# Patient Record
Sex: Male | Born: 1995 | Race: White | Hispanic: No | Marital: Single | Smoking: Never smoker
Health system: Southern US, Community
[De-identification: ages and names within clinical notes are randomized; demographics above are authoritative.]

## PROBLEM LIST (undated history)

## (undated) DIAGNOSIS — F419 Anxiety disorder, unspecified: Secondary | ICD-10-CM

## (undated) DIAGNOSIS — F32A Depression, unspecified: Secondary | ICD-10-CM

## (undated) DIAGNOSIS — F329 Major depressive disorder, single episode, unspecified: Secondary | ICD-10-CM

## (undated) HISTORY — PX: EYE SURGERY: SHX253

---

## 2017-08-01 ENCOUNTER — Emergency Department
Admission: EM | Admit: 2017-08-01 | Discharge: 2017-08-01 | Disposition: A | Discharge status: Home / Self Care | Payer: BLUE CROSS/BLUE SHIELD | Attending: Emergency Medicine | Admitting: Emergency Medicine

## 2017-08-01 DIAGNOSIS — E860 Dehydration: Secondary | ICD-10-CM | POA: Diagnosis not present

## 2017-08-01 DIAGNOSIS — F10920 Alcohol use, unspecified with intoxication, uncomplicated: Secondary | ICD-10-CM | POA: Insufficient documentation

## 2017-08-01 DIAGNOSIS — F10929 Alcohol use, unspecified with intoxication, unspecified: Secondary | ICD-10-CM | POA: Diagnosis present

## 2017-08-01 LAB — BASIC METABOLIC PANEL
ANION GAP: 11 (ref 5–15)
BUN: 16 mg/dL (ref 6–20)
CALCIUM: 8.9 mg/dL (ref 8.9–10.3)
CO2: 25 mmol/L (ref 22–32)
Chloride: 104 mmol/L (ref 101–111)
Creatinine, Ser: 0.78 mg/dL (ref 0.61–1.24)
GFR calc Af Amer: >60 mL/min (ref >60)
GFR calc non Af Amer: >60 mL/min (ref >60)
GLUCOSE: 130 mg/dL — AB (ref 65–99)
POTASSIUM: 3 mmol/L — AB (ref 3.5–5.1)
Sodium: 140 mmol/L (ref 135–145)

## 2017-08-01 LAB — CBC
HEMATOCRIT: 46.2 % (ref 40.0–52.0)
Hemoglobin: 15.7 g/dL (ref 13.0–18.0)
MCH: 28.3 pg (ref 26.0–34.0)
MCHC: 33.9 g/dL (ref 32.0–36.0)
MCV: 83.5 fL (ref 80.0–100.0)
Platelets: 460 10*3/uL — ABNORMAL HIGH (ref 150–440)
RBC: 5.53 MIL/uL (ref 4.40–5.90)
RDW: 13.3 % (ref 11.5–14.5)
WBC: 17.4 10*3/uL — AB (ref 3.8–10.6)

## 2017-08-01 LAB — ETHANOL: Alcohol, Ethyl (B): 263 mg/dL — ABNORMAL HIGH (ref <10)

## 2017-08-01 LAB — GLUCOSE, CAPILLARY: Glucose-Capillary: 112 mg/dL — ABNORMAL HIGH (ref 65–99)

## 2017-08-01 MED ORDER — POTASSIUM CHLORIDE 20 MEQ PO PACK
40.0000 meq | PACK | Freq: Once | ORAL | Status: AC
  Administered 2017-08-01: 40 meq via ORAL
  Filled 2017-08-01: qty 2

## 2017-08-01 MED ORDER — SODIUM CHLORIDE 0.9 % IV BOLUS
1000.0000 mL | Freq: Once | INTRAVENOUS | Status: AC
  Administered 2017-08-01: 1000 mL via INTRAVENOUS

## 2017-08-01 MED ORDER — DEXTROSE-NACL 5-0.45 % IV SOLN
Freq: Once | INTRAVENOUS | Status: AC
  Administered 2017-08-01: 02:00:00 via INTRAVENOUS

## 2017-08-01 MED ORDER — ONDANSETRON HCL 4 MG/2ML IJ SOLN
INTRAMUSCULAR | Status: AC
  Administered 2017-08-01: 4 mg via INTRAVENOUS
  Filled 2017-08-01: qty 2

## 2017-08-01 MED ORDER — ONDANSETRON HCL 4 MG/2ML IJ SOLN
4.0000 mg | Freq: Once | INTRAMUSCULAR | Status: AC
Start: 2017-08-01 — End: 2017-08-01
  Administered 2017-08-01: 4 mg via INTRAVENOUS

## 2017-08-01 NOTE — ED Notes (Signed)
Patient given sandwich tray and water at this time.  

## 2017-08-01 NOTE — ED Notes (Signed)
Patient resting quietly with eyes closed

## 2017-08-01 NOTE — ED Provider Notes (Signed)
[facility] Emergency Department Provider Note  ____________________________________________  Time seen: Approximately 2:14 AM  I have reviewed the triage vital signs and the nursing notes.   HISTORY  Chief Complaint Alcohol Intoxication  Level 5 Caveat: Portions of the History and Physical were unable to be obtained due to altered mental status.   HPI Julian Hudson is a 22 y.o. male brought to the ED after a large amount of alcohol intake tonight. Patient was found unconscious by trash cans. He is arousable with painful stimulus but goes back to sleep quickly. No vomiting.      past medical history No known medical problems   There are no active problems to display for this patient.    past surgical history Unable to obtain   Prior to Admission medications   Not on File  none   Allergies No known allergies   No family history on file.  Social History Social History   Tobacco Use  . Smoking status: Not on file  Substance Use Topics  . Alcohol use: Not on file  . Drug use: Not on file  positive for heavy alcohol intake tonight  Review of Systems Level 5 Caveat: Portions of the History and Physical were unable to be obtained due to altered mental status.  ____________________________________________   PHYSICAL EXAM:  VITAL SIGNS: ED Triage Vitals  Enc Vitals Group     BP 08/01/17 0105 119/72     Pulse Rate 08/01/17 0105 94     Resp 08/01/17 0105 16     Temp --      Temp src --      SpO2 08/01/17 0105 97 %     Weight 08/01/17 0110 160 lb (72.6 kg)     Height 08/01/17 0110  (1.778 m)     Head Circumference --      Peak Flow --      Pain Score --      Pain Loc --      Pain Edu? --      Excl. in GC? --     Vital signs reviewed, nursing assessments reviewed.   Constitutional:   stuporous, arousable. Not in distress Eyes:   Conjunctivae are normal. PERRL. ENT      Head:   Normocephalic and atraumatic.  Nose:   No congestion/rhinnorhea. no epistaxis      Mouth/Throat:   MMM      Neck:   No meningismus. Full ROM. Hematological/Lymphatic/Immunilogical:   No cervical lymphadenopathy. Cardiovascular:   RRR. Symmetric bilateral radial and DP pulses.  No murmurs.  Respiratory:   Normal respiratory effort without tachypnea/retractions. Breath sounds are clear and equal bilaterally. No wheezes/rales/rhonchi. Gastrointestinal:   Soft and nontender. Non distended. There is no CVA tenderness.  No rebound, rigidity, or guarding.  Musculoskeletal:   Normal range of motion in all extremities. No joint effusions.  No lower extremity tenderness.  No edema. Neurologic:   Stuporous moves all extremities when aroused  Skin:    Skin is warm, dry and intact. No rash noted.  No petechiae, purpura, or bullae.  ____________________________________________    LABS (pertinent positives/negatives) (all labs ordered are listed, but only abnormal results are displayed) Labs Reviewed  GLUCOSE, CAPILLARY - Abnormal; Notable for the following components:      Result Value   Glucose-Capillary 112 (*)    All other components within normal limits  CBC - Abnormal; Notable for the following components:   WBC 17.4 (*)  Platelets 460 (*)    All other components within normal limits  BASIC METABOLIC PANEL - Abnormal; Notable for the following components:   Potassium 3.0 (*)    Glucose, Bld 130 (*)    All other components within normal limits  ETHANOL - Abnormal; Notable for the following components:   Alcohol, Ethyl (B) 263 (*)    All other components within normal limits   ____________________________________________   EKG    ____________________________________________    RADIOLOGY  No results found.  ____________________________________________   PROCEDURES Procedures  ____________________________________________    CLINICAL IMPRESSION / ASSESSMENT AND PLAN / ED COURSE  Pertinent labs &  imaging results that were available during my care of the patient were reviewed by me and considered in my medical decision making (see chart for details).      Clinical Course as of Aug 01 704  Wynelle Link Aug 01, 2017  0106 P/w alcohol intoxication. Pt stuporous. Maintaining airway. Nl VS. Will hydrate, observe. No evidence of trauma or coingestants.    [PS]  0348 Pt now awake and alert. Reassessed. Tachycardic. Well appearing, calm, comfortable, cooperative. Exam benign. No abd tenderness. +UOP. Continue hydration. suspect significant diuresis effect from alcohol intake.    [PS]    Clinical Course User Index [PS] Sharman Cheek, MD     ----------------------------------------- 7:06 AM on 08/01/2017 -----------------------------------------  Vitals remain stable, tachycardia improved with IV hydration. Patient is awake alert oriented, tolerating oral intake, clinically sober and suitable for discharge home.  ____________________________________________   FINAL CLINICAL IMPRESSION(S) / ED DIAGNOSES    Final diagnoses:  Acute alcoholic intoxication without complication (HCC)  Dehydration     ED Discharge Orders    None      Portions of this note were generated with dragon dictation software. Dictation errors may occur despite best attempts at proofreading.    Sharman Cheek, MD 08/01/17 [phone]

## 2017-08-01 NOTE — ED Triage Notes (Signed)
EMS report pt was found unconscious out by trash cans.  Pt is diaphoretic and arousable to painful stimuli.

## 2017-08-01 NOTE — ED Notes (Signed)
Pt awakens easily to voice.  Now maintains eye contact.  Does not remember what happened.  Reoriented to place.  Pt thought he was at [facility].  Once reoriented, pt maintained cognition.  Resting quietly.  Cooperative with staff and plan of care.  Denies any needs at this time.

## 2018-01-05 ENCOUNTER — Emergency Department: Payer: BLUE CROSS/BLUE SHIELD

## 2018-01-05 ENCOUNTER — Emergency Department
Admission: EM | Admit: 2018-01-05 | Discharge: 2018-01-05 | Disposition: A | Discharge status: Home / Self Care | Payer: BLUE CROSS/BLUE SHIELD | Attending: Emergency Medicine | Admitting: Emergency Medicine

## 2018-01-05 ENCOUNTER — Other Ambulatory Visit: Payer: Self-pay

## 2018-01-05 ENCOUNTER — Encounter: Payer: Self-pay | Admitting: Emergency Medicine

## 2018-01-05 DIAGNOSIS — R51 Headache: Secondary | ICD-10-CM | POA: Diagnosis present

## 2018-01-05 DIAGNOSIS — G43901 Migraine, unspecified, not intractable, with status migrainosus: Secondary | ICD-10-CM | POA: Diagnosis not present

## 2018-01-05 LAB — BASIC METABOLIC PANEL
Anion gap: 12 (ref 5–15)
BUN: 14 mg/dL (ref 6–20)
CO2: 24 mmol/L (ref 22–32)
CREATININE: 0.69 mg/dL (ref 0.61–1.24)
Calcium: 9.3 mg/dL (ref 8.9–10.3)
Chloride: 102 mmol/L (ref 98–111)
GFR calc Af Amer: >60 mL/min (ref >60)
GFR calc non Af Amer: >60 mL/min (ref >60)
GLUCOSE: 98 mg/dL (ref 70–99)
POTASSIUM: 4 mmol/L (ref 3.5–5.1)
SODIUM: 138 mmol/L (ref 135–145)

## 2018-01-05 LAB — CBC
HCT: 44 % (ref 39.0–52.0)
HEMOGLOBIN: 15.3 g/dL (ref 13.0–17.0)
MCH: 28.3 pg (ref 26.0–34.0)
MCHC: 34.8 g/dL (ref 30.0–36.0)
MCV: 81.5 fL (ref 80.0–100.0)
Platelets: 164 10*3/uL (ref 150–400)
RBC: 5.4 MIL/uL (ref 4.22–5.81)
RDW: 11.8 % (ref 11.5–15.5)
WBC: 8 10*3/uL (ref 4.0–10.5)
nRBC: 0 % (ref 0.0–0.2)

## 2018-01-05 LAB — INFLUENZA PANEL BY PCR (TYPE A & B)
INFLAPCR: NEGATIVE
INFLBPCR: NEGATIVE

## 2018-01-05 LAB — GROUP A STREP BY PCR: Group A Strep by PCR: NOT DETECTED

## 2018-01-05 MED ORDER — KETOROLAC TROMETHAMINE 30 MG/ML IJ SOLN
15.0000 mg | Freq: Once | INTRAMUSCULAR | Status: AC
  Administered 2018-01-05: 15 mg via INTRAVENOUS
  Filled 2018-01-05: qty 1

## 2018-01-05 MED ORDER — DIPHENHYDRAMINE HCL 50 MG/ML IJ SOLN
12.5000 mg | Freq: Once | INTRAMUSCULAR | Status: AC
  Administered 2018-01-05: 12.5 mg via INTRAVENOUS
  Filled 2018-01-05: qty 1

## 2018-01-05 MED ORDER — METOCLOPRAMIDE HCL 5 MG/ML IJ SOLN
10.0000 mg | Freq: Once | INTRAMUSCULAR | Status: AC
  Administered 2018-01-05: 10 mg via INTRAVENOUS
  Filled 2018-01-05: qty 2

## 2018-01-05 MED ORDER — BUTALBITAL-APAP-CAFFEINE 50-325-40 MG PO TABS: 1.0000 | ORAL_TABLET | Freq: Four times a day (QID) | ORAL | 0 refills | Status: AC | PRN

## 2018-01-05 MED ORDER — SODIUM CHLORIDE 0.9 % IV BOLUS
1000.0000 mL | Freq: Once | INTRAVENOUS | Status: AC
  Administered 2018-01-05: 1000 mL via INTRAVENOUS

## 2018-01-05 NOTE — ED Triage Notes (Signed)
Pt states he was hospitalized in [place] last Friday for headache and swollen throat. Mono/strep neg at that time. Pt reports swelling has decreased but he still has headache, neck and back stiffness. Also reports loss of appetite and feeling disoriented. Pt states he was told by hospital in [place] that if symptoms persisted he should return to an ED. Hx migraines but states this feels different.

## 2018-01-05 NOTE — ED Notes (Signed)
Pt is wearing mask in room.

## 2018-01-05 NOTE — Discharge Instructions (Addendum)

## 2018-01-05 NOTE — ED Provider Notes (Signed)
[facility] Emergency Department Provider Note  ____________________________________________  Time seen: Approximately 2:26 PM  I have reviewed the triage vital signs and the nursing notes.   HISTORY  Chief Complaint Headache and Torticollis   HPI Julian Hudson is a 22 y.o. male with a history of anxiety migraine headaches who presents for evaluation of headache and neck pain.  Patient reports a 4 days ago he was driving to [facility] when he started feeling unwell. He reports that he felt weak and had some swelling of his jaw which made him concerned for mumps. He also developed a constant HA since then that he describes as frontal and occipital, sharp, constant, moderate in intensity, and associated with photophobia.  He went to a local ER in [place] and was tested negative for Mumps, Strep, and Mono. He felt better after living the Hospital.  He reports that the headache has been persistent.  He has tried Excedrin Migraine at home with no relief.  He reports that he feels the headache going down his neck.  He reports that the photophobia has gotten worse.  He denies thunderclap headache or severe headache, no family history of AVM or aneurysms or brain tumors.  No unilateral weakness or numbness, vertigo, changes in vision, facial droop, slurred speech, or gait instability.  He denies having any fevers.  Over the last 24 hours he has developed a mild sore throat.  He denies tick bites, rashes, chest pain, shortness of breath, URI symptoms, vomiting, diarrhea, abdominal pain.  He does endorse mild nausea.  He reports that he feels slightly disoriented at times and since the symptoms started 4 days ago.  No known sick contact exposures.  Patient is a Consulting civil engineer at [employer].  Denies any trauma.  Vaccines are up-to-date.  History reviewed. No pertinent past medical history.  There are no active problems to display for this patient.   Past Surgical History:  Procedure Laterality  Date  . EYE SURGERY      Prior to Admission medications   Medication Sig Start Date End Date Taking? Authorizing Provider  butalbital-acetaminophen-caffeine (FIORICET, ESGIC) 50-325-40 MG tablet Take 1-2 tablets by mouth every 6 (six) hours as needed for headache. 01/05/18 01/05/19  Nita Sickle, MD    Allergies Patient has no known allergies.  History reviewed. No pertinent family history.  Social History Social History   Tobacco Use  . Smoking status: Never Smoker  . Smokeless tobacco: Never Used  Substance Use Topics  . Alcohol use: Yes  . Drug use: Yes    Types: Marijuana    Review of Systems  Constitutional: Negative for fever. + disorientation Eyes: Negative for visual changes. ENT: + sore throat. Neck: No neck pain  Cardiovascular: Negative for chest pain. Respiratory: Negative for shortness of breath. Gastrointestinal: Negative for abdominal pain, vomiting or diarrhea. Genitourinary: Negative for dysuria. Musculoskeletal: Negative for back pain. Skin: Negative for rash. Neurological: Negative for weakness or numbness. + HA and photophobia Psych: No SI or HI  ____________________________________________   PHYSICAL EXAM:  VITAL SIGNS: ED Triage Vitals  Enc Vitals Group     BP 01/05/18 1250 138/80     Pulse Rate 01/05/18 1250 81     Resp 01/05/18 1250 18     Temp 01/05/18 1250 97.9 F (36.6 C)     Temp Source 01/05/18 1250 Oral     SpO2 01/05/18 1250 99 %     Weight 01/05/18 1250 155 lb (70.3 kg)  Height 01/05/18 1250 5\' 8"  (1.727 m)     Head Circumference --      Peak Flow --      Pain Score 01/05/18 1253 6     Pain Loc --      Pain Edu? --      Excl. in GC? --     Constitutional: Alert and oriented. Well appearing and in no apparent distress. HEENT:      Head: Normocephalic and atraumatic.         Eyes: Conjunctivae are normal. Sclera is non-icteric.       Mouth/Throat: Mucous membranes are moist.  Oropharynx is clear with no  swelling or exudate, no peritonsillar abscess, no parotid enlargement or tenderness, floor of the mouth is soft, no trismus, no stridor, handling his saliva with no difficulty, patent airway      Neck: Supple with no signs of meningismus. Paraspinal tenderness bilaterally, no cervical lymphadenopathy Cardiovascular: Regular rate and rhythm. No murmurs, gallops, or rubs. 2+ symmetrical distal pulses are present in all extremities. No JVD. Respiratory: Normal respiratory effort. Lungs are clear to auscultation bilaterally. No wheezes, crackles, or rhonchi.  Gastrointestinal: Soft, non tender, and non distended with positive bowel sounds. No rebound or guarding. Musculoskeletal: Nontender with normal range of motion in all extremities. No edema, cyanosis, or erythema of extremities. Neurologic: Normal speech and language. A & O x3, PERRL, EOMI, no nystagmus, CN II-XII intact, motor testing reveals good tone and bulk throughout. There is no evidence of pronator drift or dysmetria. Muscle strength is 5/5 throughout. Sensory examination is intact. Gait is normal. Skin: Skin is warm, dry and intact. No rash noted. Psychiatric: Mood and affect are normal. Speech and behavior are normal.  ____________________________________________   LABS (all labs ordered are listed, but only abnormal results are displayed)  Labs Reviewed  GROUP A STREP BY PCR  BASIC METABOLIC PANEL  CBC  INFLUENZA PANEL BY PCR (TYPE A & B)   ____________________________________________  EKG  none  ____________________________________________  RADIOLOGY  I have personally reviewed the images performed during this visit and I agree with the Radiologist's read.   Interpretation by Radiologist:  Ct Head Wo Contrast  Result Date: 01/05/2018 CLINICAL DATA:  Headache with neck stiffness. Intermittent disorientation EXAM: CT HEAD WITHOUT CONTRAST TECHNIQUE: Contiguous axial images were obtained from the base of the skull  through the vertex without intravenous contrast. COMPARISON:  None. FINDINGS: Brain: The ventricles are normal in size and configuration. There is mild invagination of CSF into the sella. There is no intracranial mass, hemorrhage, extra-axial fluid collection, or midline shift. Brain parenchyma appears normal. No acute infarct evident. Vascular: No hyperdense vessel.  No vascular calcification evident. Skull: Bony calvarium appears intact. Sinuses/Orbits: There is a subcentimeter osteoma in the left frontal sinus. Visualized paranasal sinuses are otherwise clear. Visualized orbits appear symmetric bilaterally. Other: Mastoid air cells are clear. 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 Ventricles normal in size and configuration. Brain parenchyma appears normal. No mass or hemorrhage. Subcentimeter benign osteoma left frontal region. Study otherwise unremarkable. Electronically Signed   By: Bretta Bang III M.D.   On: 01/05/2018 14:54      ____________________________________________   PROCEDURES  Procedure(s) performed: None Procedures Critical Care performed:  None ____________________________________________   INITIAL IMPRESSION / ASSESSMENT AND PLAN / ED COURSE   22 y.o. male with a history of anxiety migraine headaches who presents for evaluation of headache and neck pain  x 4 days, photophobia, and mild disorientation.  Patient has had no fever and symptoms are ongoing for 4 days therefore at this time no clinical signs or symptoms of bacterial meningitis with normal vitals, normal neuro exam, normal white count.  He does have paraspinal tenderness bilaterally but no true meningeal signs on exam.  Patient does have a history of migraine headaches and I believe his symptoms are most likely due to a viral etiology causing a exacerbation of his migraines.  Will treat with a migraine cocktail.  Due to ongoing symptoms for 4 days and  concerns for disorientation we will send patient for CT scan.  There is no thunderclap headache, no trauma, no neurological deficits making subarachnoid hemorrhage extremely less likely.  At this time do not believe patient warrants an LP.  We will recheck for strep, check a flu swab.  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 Discussed with Dr. Myer Haff who says this is an incidental finding and not the cause of patient's symptoms. Labs, Flu, and strep all negative. Patient's symptoms resolved after migraine cocktail. Will dc home, provide prescription for fioricet, close f/u with PCP.  Discussed return precautions for fever, thunderclap headache, neurological deficits.   [CV]    Clinical Course User Index [CV] Don Perking [place], MD     As part of my medical decision making, I reviewed the following data within the electronic MEDICAL RECORD NUMBER Nursing notes reviewed and incorporated, Labs reviewed , Radiograph reviewed , A consult was requested and obtained from this/these consultant(s) NSG, Notes from prior ED visits and [place] Controlled Substance Database    Pertinent labs & imaging results that were available during my care of the patient were reviewed by me and considered in my medical decision making (see chart for details).    ____________________________________________   FINAL CLINICAL IMPRESSION(S) / ED DIAGNOSES  Final diagnoses:  Migraine with status migrainosus, not intractable, unspecified migraine type      NEW MEDICATIONS STARTED DURING THIS VISIT:  ED Discharge Orders         Ordered    butalbital-acetaminophen-caffeine (FIORICET, ESGIC) 50-325-40 MG tablet  Every 6 hours PRN     01/05/18 1555           Note:  This document was prepared using Dragon voice recognition software and may include unintentional dictation errors.    Don Perking, [place], MD 01/05/18 1556

## 2018-01-05 NOTE — ED Notes (Addendum)
Pt states he has had a headache and stiff neck and photophobia x1week with worsening symptoms. Pt states he has had trouble focusing and putting thoughts together.

## 2018-01-05 NOTE — ED Triage Notes (Signed)
First Nurse Note:  Arrives with c/o headache, stiff neck, light sensitivity and disorientation x 5 days.  Denies fever.  States was recently in [place] was seen through the ED for same symptoms.  AAOx3.  Skin warm and dry. Ambulates with easy and steady gait.  NAD

## 2018-05-30 ENCOUNTER — Other Ambulatory Visit: Payer: Self-pay

## 2018-05-30 ENCOUNTER — Emergency Department
Admission: EM | Admit: 2018-05-30 | Discharge: 2018-05-31 | Disposition: A | Discharge status: Home / Self Care | Payer: BLUE CROSS/BLUE SHIELD | Attending: Emergency Medicine | Admitting: Emergency Medicine

## 2018-05-30 DIAGNOSIS — R42 Dizziness and giddiness: Secondary | ICD-10-CM | POA: Insufficient documentation

## 2018-05-30 HISTORY — DX: Major depressive disorder, single episode, unspecified: F32.9

## 2018-05-30 HISTORY — DX: Depression, unspecified: F32.A

## 2018-05-30 HISTORY — DX: Anxiety disorder, unspecified: F41.9

## 2018-05-30 LAB — URINALYSIS, COMPLETE (UACMP) WITH MICROSCOPIC
Bacteria, UA: NONE SEEN
Bilirubin Urine: NEGATIVE
Glucose, UA: NEGATIVE mg/dL
Hgb urine dipstick: NEGATIVE
Ketones, ur: NEGATIVE mg/dL
Leukocytes,Ua: NEGATIVE
NITRITE: NEGATIVE
Protein, ur: NEGATIVE mg/dL
SPECIFIC GRAVITY, URINE: 1.011 (ref 1.005–1.030)
WBC, UA: NONE SEEN WBC/hpf (ref 0–5)
pH: 7 (ref 5.0–8.0)

## 2018-05-30 LAB — BASIC METABOLIC PANEL
Anion gap: 8 (ref 5–15)
BUN: 18 mg/dL (ref 6–20)
CO2: 25 mmol/L (ref 22–32)
Calcium: 8.7 mg/dL — ABNORMAL LOW (ref 8.9–10.3)
Chloride: 107 mmol/L (ref 98–111)
Creatinine, Ser: 1.07 mg/dL (ref 0.61–1.24)
GFR calc Af Amer: >60 mL/min (ref >60)
GFR calc non Af Amer: >60 mL/min (ref >60)
Glucose, Bld: 128 mg/dL — ABNORMAL HIGH (ref 70–99)
Potassium: 3.4 mmol/L — ABNORMAL LOW (ref 3.5–5.1)
Sodium: 140 mmol/L (ref 135–145)

## 2018-05-30 LAB — CBC
HCT: 44.6 % (ref 39.0–52.0)
Hemoglobin: 15.1 g/dL (ref 13.0–17.0)
MCH: 28.2 pg (ref 26.0–34.0)
MCHC: 33.9 g/dL (ref 30.0–36.0)
MCV: 83.2 fL (ref 80.0–100.0)
Platelets: 279 10*3/uL (ref 150–400)
RBC: 5.36 MIL/uL (ref 4.22–5.81)
RDW: 12.5 % (ref 11.5–15.5)
WBC: 8.5 10*3/uL (ref 4.0–10.5)
nRBC: 0 % (ref 0.0–0.2)

## 2018-05-30 MED ORDER — SODIUM CHLORIDE 0.9% FLUSH: 3.0000 mL | Freq: Once | INTRAVENOUS | Status: DC

## 2018-05-30 MED ORDER — SODIUM CHLORIDE 0.9 % IV BOLUS
1000.0000 mL | Freq: Once | INTRAVENOUS | Status: AC
  Administered 2018-05-30: 1000 mL via INTRAVENOUS

## 2018-05-30 NOTE — ED Triage Notes (Signed)
Pt to the er because his [place] psychiatrist told him to come to rule out cardiac issues versus anxiety. Pt is currently taking meds for anxiety. Pt reports dizziness, nausea and  Not feeling well. Pt states he is taking guanfacine.

## 2018-05-30 NOTE — ED Provider Notes (Signed)
[facility] Emergency Department Provider Note  ____________________________________________   First MD Initiated Contact with Patient 05/30/18 2314     (approximate)  I have reviewed the triage vital signs and the nursing notes.   HISTORY  Chief Complaint Dizziness    HPI Julian Hudson is a 23 y.o. male who has a history of anxiety and depression and who is a sophomore at [employer].  He presents for evaluation of  some symptoms that his psychiatrist at home in [place] was concerned could represent a medical issue rather than psychiatric issue.  The symptoms he is been experiencing over the last few weeks include dizziness, nausea, general malaise, and some lightheadedness, particular when changing position.  He is active in school, plays soccer, and lifts weights every day.  He says that he takes Intuniv and that it has "messed with my appetite" and he feels like he may not be eating and drinking enough recently.  He also states that when he was at home in [place] he was evaluated by a cardiologist who said that his heart rate goes up much faster when he stands up compared to when he lies down, and when he goes home he is going to continue the cardiology evaluation.  He describes the symptoms as is persistent over the last couple weeks.  Nothing in particular makes it better or worse.  He is not having any symptoms right now except that he still sometimes feels lightheaded.  He denies chest pain, shortness of breath, vomiting, abdominal pain, fever/chills, neck pain or neck stiffness.  He describes the symptoms overall as severe but they wax and wane.  He denies drug use and denies any new medications.     Past Medical History:  Diagnosis Date  . Anxiety   . Depression     There are no active problems to display for this patient.   Past Surgical History:  Procedure Laterality Date  . EYE SURGERY      Prior to Admission medications   Medication Sig  Start Date End Date Taking? Authorizing Provider  butalbital-acetaminophen-caffeine (FIORICET, ESGIC) 50-325-40 MG tablet Take 1-2 tablets by mouth every 6 (six) hours as needed for headache. 01/05/18 01/05/19  Nita Sickle, MD    Allergies Patient has no known allergies.  No family history on file.  Social History Social History   Tobacco Use  . Smoking status: Never Smoker  . Smokeless tobacco: Never Used  Substance Use Topics  . Alcohol use: Yes  . Drug use: Not Currently    Review of Systems Constitutional: No fever/chills.  Generalized lightheadedness or dizziness. Eyes: No visual changes. ENT: No sore throat. Cardiovascular: Denies chest pain. Respiratory: Denies shortness of breath. Gastrointestinal: No abdominal pain.  Intermittent nausea, no vomiting.  No diarrhea.  No constipation. Genitourinary: Negative for dysuria. Musculoskeletal: Negative for neck pain.  Negative for back pain. Integumentary: Negative for rash. Neurological: Generalized lightheadedness or dizziness.  Negative for headaches, focal weakness or numbness.   ____________________________________________   PHYSICAL EXAM:  VITAL SIGNS: ED Triage Vitals  Enc Vitals Group     BP 05/30/18 2127 (!) 149/83     Pulse Rate 05/30/18 2127 82     Resp 05/30/18 2127 16     Temp 05/30/18 2127 98.4 F (36.9 C)     Temp Source 05/30/18 2127 Oral     SpO2 05/30/18 2127 98 %     Weight 05/30/18 2128 72.6 kg (160 lb)     Height  05/30/18 2128 1.702 m (5\' 7" )     Head Circumference --      Peak Flow --      Pain Score 05/30/18 2128 4     Pain Loc --      Pain Edu? --      Excl. in GC? --     Constitutional: Alert and oriented. Well appearing and in no acute distress. Eyes: Conjunctivae are normal. PERRL. EOMI. Head: Atraumatic. Nose: No congestion/rhinnorhea. Mouth/Throat: Mucous membranes are moist. Neck: No stridor.  No meningeal signs.   Cardiovascular: Normal rate, regular rhythm. Good  peripheral circulation. Grossly normal heart sounds. Respiratory: Normal respiratory effort.  No retractions. Lungs CTAB. Gastrointestinal: Soft and nontender. No distention.  Musculoskeletal: No lower extremity tenderness nor edema. No gross deformities of extremities. Neurologic:  Normal speech and language. No gross focal neurologic deficits are appreciated.  Skin:  Skin is warm, dry and intact. No rash noted. Psychiatric: Mood and affect are normal. Speech and behavior are normal.  ____________________________________________   LABS (all labs ordered are listed, but only abnormal results are displayed)  Labs Reviewed  BASIC METABOLIC PANEL - Abnormal; Notable for the following components:      Result Value   Potassium 3.4 (*)    Glucose, Bld 128 (*)    Calcium 8.7 (*)    All other components within normal limits  URINALYSIS, COMPLETE (UACMP) WITH MICROSCOPIC - Abnormal; Notable for the following components:   Color, Urine STRAW (*)    APPearance CLEAR (*)    All other components within normal limits  CBC  TROPONIN I  CBG MONITORING, ED   ____________________________________________  EKG  ED ECG REPORT I, Loleta Rose, the attending physician, personally viewed and interpreted this ECG.  Date: 05/30/2018 EKG Time: 21: 35 Rate: 82 Rhythm: normal sinus rhythm QRS Axis: normal Intervals: normal ST/T Wave abnormalities: Non-specific ST segment / T-wave changes, but no clear evidence of acute ischemia. Narrative Interpretation: no definitive evidence of acute ischemia; does not meet STEMI criteria.   ____________________________________________  RADIOLOGY   ED MD interpretation: No indication for imaging  Official radiology report(s): No results found.  ____________________________________________   PROCEDURES   Procedure(s) performed (including Critical Care):  Procedures   ____________________________________________   INITIAL IMPRESSION / MDM /  ASSESSMENT AND PLAN / ED COURSE  As part of my medical decision making, I reviewed the following data within the electronic MEDICAL RECORD NUMBER Nursing notes reviewed and incorporated, Labs reviewed , EKG interpreted  and Old chart reviewed         Differential diagnosis includes, but is not limited to, anxiety/panic attacks, medication side effect, POTS syndrome, volume depletion leading to orthostatic hypotension, electrolyte abnormality, much less likely ACS.  The patient is well-appearing and in no distress, texting on his phone and having an easy and appropriate and normal conversation with me.  Symptoms have been going on for a couple of weeks.  No indication of ischemia on EKG, normal vital signs.  The work-up he describes to me that he had in [place] suggest he may have POTS syndrome, but it sounds like it is still being evaluated.  He feels well enough that he is still able to play soccer and lift weights every day as well as going to school.  We had a discussion about orthostatics and I provided reassurance about his lab work which is all within normal limits and reassuring.  His potassium is very slightly decreased at 3.4 but this is likely  noncontributory.  We agreed to place an IV and give 1 L normal saline IV bolus and then he will be discharged for outpatient follow-up.  There is no indication for further evaluation at this time and he is going to continue working with his psychiatrist as well as his doctors at home.  No indication of emergent medical condition currently.  He understands and agrees with the plan.     ____________________________________________  FINAL CLINICAL IMPRESSION(S) / ED DIAGNOSES  Final diagnoses:  Lightheadedness  Dizziness     MEDICATIONS GIVEN DURING THIS VISIT:  Medications  sodium chloride flush (NS) 0.9 % injection 3 mL (has no administration in time range)  sodium chloride 0.9 % bolus 1,000 mL (1,000 mLs Intravenous New Bag/Given 05/30/18 2359)      ED Discharge Orders    None       Note:  This document was prepared using Dragon voice recognition software and may include unintentional dictation errors.   Loleta Rose, MD 05/31/18 [phone]

## 2018-05-30 NOTE — Discharge Instructions (Signed)
Your workup in the Emergency Department today was reassuring.  We did not find any specific abnormalities.  As we discussed, it is likely that your symptoms are multifactorial; you likely are not drinking enough fluid in the setting of all of your activities (classes, soccer, weightlifting, etc.), and your medications may also be causing or contributing to some of your symptoms.  We recommend you drink plenty of fluids, take your regular medications and/or any new ones prescribed today, and follow up with your psychiatrist and other doctors back home.  I also included some information about panic attacks that you may want to read because you might find that some of the symptoms make sense are applied to you.  Return to the Emergency Department if you develop new or worsening symptoms that concern you.

## 2018-05-31 LAB — TROPONIN I

## 2018-06-07 ENCOUNTER — Emergency Department: Payer: BLUE CROSS/BLUE SHIELD

## 2018-06-07 ENCOUNTER — Encounter: Payer: Self-pay | Admitting: Emergency Medicine

## 2018-06-07 ENCOUNTER — Emergency Department
Admission: EM | Admit: 2018-06-07 | Discharge: 2018-06-08 | Disposition: A | Discharge status: Home / Self Care | Payer: BLUE CROSS/BLUE SHIELD | Attending: Emergency Medicine | Admitting: Emergency Medicine

## 2018-06-07 ENCOUNTER — Other Ambulatory Visit: Payer: Self-pay

## 2018-06-07 DIAGNOSIS — R42 Dizziness and giddiness: Secondary | ICD-10-CM | POA: Diagnosis present

## 2018-06-07 DIAGNOSIS — R55 Syncope and collapse: Secondary | ICD-10-CM

## 2018-06-07 DIAGNOSIS — R002 Palpitations: Secondary | ICD-10-CM | POA: Diagnosis not present

## 2018-06-07 LAB — TROPONIN I: Troponin I: <0.03 ng/mL (ref <0.03)

## 2018-06-07 LAB — BASIC METABOLIC PANEL
Anion gap: 12 (ref 5–15)
BUN: 22 mg/dL — ABNORMAL HIGH (ref 6–20)
CHLORIDE: 106 mmol/L (ref 98–111)
CO2: 22 mmol/L (ref 22–32)
Calcium: 10.2 mg/dL (ref 8.9–10.3)
Creatinine, Ser: 0.87 mg/dL (ref 0.61–1.24)
GFR calc Af Amer: >60 mL/min (ref >60)
GFR calc non Af Amer: >60 mL/min (ref >60)
Glucose, Bld: 101 mg/dL — ABNORMAL HIGH (ref 70–99)
Potassium: 3.6 mmol/L (ref 3.5–5.1)
Sodium: 140 mmol/L (ref 135–145)

## 2018-06-07 LAB — CBC
HEMATOCRIT: 46.6 % (ref 39.0–52.0)
Hemoglobin: 15.9 g/dL (ref 13.0–17.0)
MCH: 27.8 pg (ref 26.0–34.0)
MCHC: 34.1 g/dL (ref 30.0–36.0)
MCV: 81.5 fL (ref 80.0–100.0)
Platelets: 348 10*3/uL (ref 150–400)
RBC: 5.72 MIL/uL (ref 4.22–5.81)
RDW: 12.3 % (ref 11.5–15.5)
WBC: 10.7 10*3/uL — AB (ref 4.0–10.5)
nRBC: 0 % (ref 0.0–0.2)

## 2018-06-07 LAB — TSH: TSH: 2.347 u[IU]/mL (ref 0.350–4.500)

## 2018-06-07 MED ORDER — SODIUM CHLORIDE 0.9% FLUSH: 3.0000 mL | Freq: Once | INTRAVENOUS | Status: DC

## 2018-06-07 NOTE — ED Notes (Signed)
Pt reports dizziness and feeling lightheaded for several days.  No n/v/d.  Pt seen in er recently for similar sx.  No chest pain.  No sob.  Pt alert.

## 2018-06-07 NOTE — ED Triage Notes (Signed)
Pt arrived to the ED via POV from home for complaints of dizziness, palpitations and not feeling good. Pt reports that he was in his apartment and changed positions and started to feel dizzy with vision changes. Pt reports that he was seen here for the same last week and was told that he has to go see a cardiologist, which he has not been able to go see. Pt is AOx4 in no apparent distress.

## 2018-06-07 NOTE — ED Provider Notes (Addendum)
[facility] Emergency Department Provider Note    First MD Initiated Contact with Patient 06/07/18 2332     (approximate)  I have reviewed the triage vital signs and the nursing notes.   HISTORY  Chief Complaint Near Syncope    HPI Julian Hudson is a 23 y.o. male with below list of previous medical conditions returns to the emergency department secondary to continued dizziness palpitations.  Patient was evaluated emergency department on 05/30/2018 for the same with recommendation to follow-up with cardiology and neurology as planned.  Patient presents today stating dizziness with change in position (i.e. sitting to standing or laying to sitting).  Patient states when he does so he feels as if he will "pass out".  Patient denies any chest pain no shortness of breath.  Patient denies any lower extremity pain or swelling.  Patient denies any personal familial history of DVT or PE or CAD.  Patient states as a result of his symptoms he has been unable to "do school".  Patient states that he has an appointment with both cardiology and neurology at home in [facility] next week.        Past Medical History:  Diagnosis Date  . Anxiety   . Depression     There are no active problems to display for this patient.   Past Surgical History:  Procedure Laterality Date  . EYE SURGERY      Prior to Admission medications   Medication Sig Start Date End Date Taking? Authorizing Provider  butalbital-acetaminophen-caffeine (FIORICET, ESGIC) 50-325-40 MG tablet Take 1-2 tablets by mouth every 6 (six) hours as needed for headache. 01/05/18 01/05/19  Nita Sickle, MD    Allergies Patient has no known allergies.  History reviewed. No pertinent family history.  Social History Social History   Tobacco Use  . Smoking status: Never Smoker  . Smokeless tobacco: Never Used  Substance Use Topics  . Alcohol use: Yes  . Drug use: Not Currently    Review of  Systems Constitutional: No fever/chills Eyes: No visual changes. ENT: No sore throat. Cardiovascular: Denies chest pain. Respiratory: Denies shortness of breath. Gastrointestinal: No abdominal pain.  No nausea, no vomiting.  No diarrhea.  No constipation. Genitourinary: Negative for dysuria. Musculoskeletal: Negative for neck pain.  Negative for back pain. Integumentary: Negative for rash. Neurological: Negative for headaches, focal weakness or numbness.   ____________________________________________   PHYSICAL EXAM:  VITAL SIGNS: ED Triage Vitals  Enc Vitals Group     BP 06/07/18 2039 (!) 151/85     Pulse Rate 06/07/18 2039 (!) 115     Resp 06/07/18 2039 16     Temp 06/07/18 2039 97.8 F (36.6 C)     Temp Source 06/07/18 2039 Oral     SpO2 06/07/18 2039 100 %     Weight 06/07/18 2040 73 kg (161 lb)     Height 06/07/18 2040 1.702 m (5\' 7" )     Head Circumference --      Peak Flow --      Pain Score 06/07/18 2040 4     Pain Loc --      Pain Edu? --      Excl. in GC? --     Constitutional: Alert and oriented. Well appearing and in no acute distress. Eyes: Conjunctivae are normal. PERRL. EOMI. Mouth/Throat: Mucous membranes are moist.  Oropharynx non-erythematous. Neck: No stridor.   Cardiovascular: Normal rate, regular rhythm. Good peripheral circulation. Grossly normal heart sounds. Respiratory: Normal respiratory  effort.  No retractions. Lungs CTAB. Gastrointestinal: Soft and nontender. No distention.  Musculoskeletal: No lower extremity tenderness nor edema. No gross deformities of extremities. Neurologic:  Normal speech and language. No gross focal neurologic deficits are appreciated.  Skin:  Skin is warm, dry and intact. No rash noted. Psychiatric: Mood and affect are normal. Speech and behavior are normal.  ____________________________________________   LABS (all labs ordered are listed, but only abnormal results are displayed)  Labs Reviewed  BASIC  METABOLIC PANEL - Abnormal; Notable for the following components:      Result Value   Glucose, Bld 101 (*)    BUN 22 (*)    All other components within normal limits  CBC - Abnormal; Notable for the following components:   WBC 10.7 (*)    All other components within normal limits  TROPONIN I  TSH   ____________________________________________  EKG  ED ECG REPORT I, [place] N , the attending physician, personally viewed and interpreted this ECG.   Date: 06/08/2018  EKG Time: 8:41 PM  Rate: 101  Rhythm: Sinus tachycardia  Axis: Normal  Intervals: Normal  ST&T Change: None  ____________________________________________  RADIOLOGY I, [place] N , personally viewed and evaluated these images (plain radiographs) as part of my medical decision making, as well as reviewing the written report by the radiologist.  ED MD interpretation: No active cardiopulmonary disease noted on chest x-ray per radiologist. Official radiology report(s): Dg Chest 2 View  Result Date: 06/07/2018 CLINICAL DATA:  Dizziness and weakness EXAM: CHEST - 2 VIEW COMPARISON:  None. FINDINGS: The heart size and mediastinal contours are within normal limits. Both lungs are clear. The visualized skeletal structures are unremarkable. IMPRESSION: No active cardiopulmonary disease. Electronically Signed   By: Jasmine Pang M.D.   On: 06/07/2018 21:04     CT head revealed no acute intracranial abnormality per radiologist.  ____________________________________________   PROCEDURES   Procedure(s) performed (including Critical Care):  Procedures   ____________________________________________   INITIAL IMPRESSION / MDM / ASSESSMENT AND PLAN / ED COURSE  As part of my medical decision making, I reviewed the following data within the electronic MEDICAL RECORD NUMBER   23 year old male presenting to the emergency department above-stated history and physical exam.  Consider the possibility of arrhythmia  however the patient's EKG revealed no evidence of arrhythmia.  Patient also revealed no evidence of arrhythmia on the cardiac monitor.  Considered possibility of intracranial abnormality however CT scan of the head unremarkable.  Laboratory data also normal including TSH.  Patient advised to follow-up with cardiology and neurology as planned.  ____________________________________________  FINAL CLINICAL IMPRESSION(S) / ED DIAGNOSES  Final diagnoses:  Near syncope     MEDICATIONS GIVEN DURING THIS VISIT:  Medications  sodium chloride flush (NS) 0.9 % injection 3 mL (has no administration in time range)     ED Discharge Orders    None       Note:  This document was prepared using Dragon voice recognition software and may include unintentional dictation errors.    Darci Current, MD 06/08/18 [id number]    Darci Current, MD 06/08/18 [phone]

## 2018-06-08 ENCOUNTER — Emergency Department: Payer: BLUE CROSS/BLUE SHIELD

## 2018-06-08 NOTE — ED Notes (Signed)
Pt resting quietly.  Pt alert.  nsr on monitor.   

## 2019-01-21 ENCOUNTER — Emergency Department
Admission: EM | Admit: 2019-01-21 | Discharge: 2019-01-22 | Disposition: A | Discharge status: Home / Self Care | Payer: BC Managed Care – PPO | Attending: Emergency Medicine | Admitting: Emergency Medicine

## 2019-01-21 ENCOUNTER — Emergency Department: Payer: BC Managed Care – PPO

## 2019-01-21 ENCOUNTER — Encounter: Payer: Self-pay | Admitting: Emergency Medicine

## 2019-01-21 ENCOUNTER — Other Ambulatory Visit: Payer: Self-pay

## 2019-01-21 DIAGNOSIS — R079 Chest pain, unspecified: Secondary | ICD-10-CM | POA: Diagnosis present

## 2019-01-21 DIAGNOSIS — Z79899 Other long term (current) drug therapy: Secondary | ICD-10-CM | POA: Insufficient documentation

## 2019-01-21 DIAGNOSIS — R0789 Other chest pain: Secondary | ICD-10-CM | POA: Insufficient documentation

## 2019-01-21 LAB — CBC
HCT: 45.5 % (ref 39.0–52.0)
Hemoglobin: 15.4 g/dL (ref 13.0–17.0)
MCH: 28.2 pg (ref 26.0–34.0)
MCHC: 33.8 g/dL (ref 30.0–36.0)
MCV: 83.2 fL (ref 80.0–100.0)
Platelets: 284 10*3/uL (ref 150–400)
RBC: 5.47 MIL/uL (ref 4.22–5.81)
RDW: 11.6 % (ref 11.5–15.5)
WBC: 9.5 10*3/uL (ref 4.0–10.5)
nRBC: 0 % (ref 0.0–0.2)

## 2019-01-21 LAB — BASIC METABOLIC PANEL
Anion gap: 12 (ref 5–15)
BUN: 23 mg/dL — ABNORMAL HIGH (ref 6–20)
CO2: 26 mmol/L (ref 22–32)
Calcium: 9.6 mg/dL (ref 8.9–10.3)
Chloride: 104 mmol/L (ref 98–111)
Creatinine, Ser: 1.22 mg/dL (ref 0.61–1.24)
GFR calc Af Amer: >60 mL/min (ref >60)
GFR calc non Af Amer: >60 mL/min (ref >60)
Glucose, Bld: 103 mg/dL — ABNORMAL HIGH (ref 70–99)
Potassium: 3.7 mmol/L (ref 3.5–5.1)
Sodium: 142 mmol/L (ref 135–145)

## 2019-01-21 LAB — TROPONIN I (HIGH SENSITIVITY): Troponin I (High Sensitivity): 6 ng/L (ref <18)

## 2019-01-21 NOTE — ED Triage Notes (Addendum)
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

## 2019-01-22 NOTE — Discharge Instructions (Addendum)
You should take ibuprofen 600 mg up to every 6 hours (with food) for the next few days, and avoid strenuous exercise using your chest or upper body muscles for the next several days.  Return to the ER immediately for new, worsening, or persistent severe chest pain, shortness of breath, weakness or lightheadedness, high fever, or any other new or worsening symptoms that concern you.

## 2019-01-22 NOTE — ED Provider Notes (Signed)
[facility] Emergency Department Provider Note ____________________________________________   First MD Initiated Contact with Patient 01/22/19 0002     (approximate)  I have reviewed the triage vital signs and the nursing notes.   HISTORY  Chief Complaint Chest Pain and Fatigue    HPI Julian Hudson is a 23 y.o. male with PMH as noted below who presents with chest pain, gradual onset over approximately the last 3 days, described as mainly substernal but sometimes radiating down to the right lower anterior chest.  He states it is worse when he sneezes, and worse when he changes position especially sitting up from lying down.  He states that it is not exertional.  He denies shortness of breath, fever, cough, or any weakness or lightheadedness.  The patient had a Covid test done 2 days ago but does not yet have the result.  He does report that [facility].  The patient states he does weight lifting, but denies any new or unusual exercises.  He denies any leg pain or swelling.  Past Medical History:  Diagnosis Date  . Anxiety   . Depression     There are no active problems to display for this patient.   Past Surgical History:  Procedure Laterality Date  . EYE SURGERY      Prior to Admission medications   Medication Sig Start Date End Date Taking? Authorizing Provider  cloNIDine (CATAPRES) 0.2 MG tablet Take 0.2 mg by mouth 2 (two) times daily.   Yes [provider]  venlafaxine XR (EFFEXOR-XR) 37.5 MG 24 hr capsule Take 37.5 mg by mouth daily with breakfast.   Yes [provider]    Allergies Patient has no known allergies.  History reviewed. No pertinent family history.  Social History Social History   Tobacco Use  . Smoking status: Never Smoker  . Smokeless tobacco: Never Used  Substance Use Topics  . Alcohol use: Yes  . Drug use: Not Currently    Review of Systems  Constitutional: No  fever/chills Eyes: No visual changes. ENT: No sore throat. Cardiovascular: Positive for chest pain. Respiratory: Denies shortness of breath. Gastrointestinal: No vomiting or diarrhea.  Genitourinary: Negative for dysuria.  Musculoskeletal: Negative for back pain. Skin: Negative for rash. Neurological: Negative for headache.   ____________________________________________   PHYSICAL EXAM:  VITAL SIGNS: ED Triage Vitals  Enc Vitals Group     BP 01/21/19 1922 137/65     Pulse Rate 01/21/19 1922 80     Resp 01/21/19 1922 16     Temp 01/21/19 1922 98.5 F (36.9 C)     Temp Source 01/21/19 1922 Oral     SpO2 01/21/19 1922 100 %     Weight 01/21/19 1923 160 lb (72.6 kg)     Height 01/21/19 1923 5\' 8"  (1.727 m)     Head Circumference --      Peak Flow --      Pain Score 01/21/19 1923 6     Pain Loc --      Pain Edu? --      Excl. in GC? --     Constitutional: Alert and oriented. Well appearing and in no acute distress. Eyes: Conjunctivae are normal.  Head: Atraumatic. Nose: No congestion/rhinnorhea. Mouth/Throat: Mucous membranes are moist.   Neck: Normal range of motion.  Cardiovascular: Normal rate, regular rhythm. Grossly normal heart sounds.  Good peripheral circulation. Respiratory: Normal respiratory effort.  No retractions. Lungs CTAB.  Gastrointestinal: Soft and nontender. No distention.  Genitourinary: No CVA tenderness. Musculoskeletal: No lower extremity edema.  Extremities warm and well perfused.  Somewhat reproducible substernal and left chest wall tenderness. Neurologic:  Normal speech and language. No gross focal neurologic deficits are appreciated.  Skin:  Skin is warm and dry. No rash noted. Psychiatric: Mood and affect are normal. Speech and behavior are normal.  ____________________________________________   LABS (all labs ordered are listed, but only abnormal results are displayed)  Labs Reviewed  BASIC METABOLIC PANEL - Abnormal; Notable for the  following components:      Result Value   Glucose, Bld 103 (*)    BUN 23 (*)    All other components within normal limits  CBC  TROPONIN I (HIGH SENSITIVITY)   ____________________________________________  EKG  ED ECG REPORT I, Arta Silence, the attending physician, personally viewed and interpreted this ECG.  Date: 01/22/2019 EKG Time: 1918 Rate: 82 Rhythm: normal sinus rhythm QRS Axis: normal Intervals: normal ST/T Wave abnormalities: normal Narrative Interpretation: no evidence of acute ischemia  ____________________________________________  RADIOLOGY  CXR: No focal infiltrate or other acute abnormality  ____________________________________________   PROCEDURES  Procedure(s) performed: No  Procedures  Critical Care performed: No ____________________________________________   INITIAL IMPRESSION / ASSESSMENT AND PLAN / ED COURSE  Pertinent labs & imaging results that were available during my care of the patient were reviewed by me and considered in my medical decision making (see chart for details).  23 year old male with PMH as noted above presents with atypical chest pain over the last 3 days which is worse with sneezing and with changes in position.  He denies exertional symptoms, shortness of breath, lightheadedness, or nausea.  He has been tested for COVID-19 but does not yet know the result.  On exam the patient is very well-appearing.  His vital signs are normal.  The physical exam is unremarkable except that I was able to reproduce the chest pain with palpation of the substernal and right anterior chest wall.  Overall presentation is consistent with musculoskeletal chest pain.  The patient has no ACS risk factors and no signs or symptoms concerning for ACS.  He is PERC negative and there is no clinical evidence to suggest DVT or PE.  It is possible that he could have Covid although he does not have any significant symptoms.  The nature of the pain in  the physical exam is much more consistent with musculoskeletal cause.  Basic labs, troponin, and chest x-ray were all within normal limits.  EKG was normal as well.  At this time, the patient is stable for discharge home.  I counseled him on the results of the work-up.  I recommend ibuprofen and rest.  Return precautions given, and he expresses understanding.  ____________________________  Julian Hudson was evaluated in Emergency Department on 01/22/2019 for the symptoms described in the history of present illness. He was evaluated in the context of the global COVID-19 pandemic, which necessitated consideration that the patient might be at risk for infection with the SARS-CoV-2 virus that causes COVID-19. Institutional protocols and algorithms that pertain to the evaluation of patients at risk for COVID-19 are in a state of rapid change based on information released by regulatory bodies including the CDC and federal and state organizations. These policies and algorithms were followed during the patient's care in the ED.  ____________________________________________   FINAL CLINICAL IMPRESSION(S) / ED DIAGNOSES  Final diagnoses:  Chest wall pain      NEW MEDICATIONS  STARTED DURING THIS VISIT:  New Prescriptions   No medications on file     Note:  This document was prepared using Dragon voice recognition software and may include unintentional dictation errors.    Dionne Bucy, MD 01/22/19 [phone]

## 2019-04-25 ENCOUNTER — Ambulatory Visit: Payer: BC Managed Care – PPO | Attending: Internal Medicine

## 2019-04-25 DIAGNOSIS — Z20822 Contact with and (suspected) exposure to covid-19: Secondary | ICD-10-CM

## 2019-04-26 LAB — NOVEL CORONAVIRUS, NAA: SARS-CoV-2, NAA: NOT DETECTED

## 2019-05-03 ENCOUNTER — Ambulatory Visit: Payer: BC Managed Care – PPO | Attending: Internal Medicine

## 2019-05-03 DIAGNOSIS — Z20822 Contact with and (suspected) exposure to covid-19: Secondary | ICD-10-CM

## 2019-05-04 LAB — NOVEL CORONAVIRUS, NAA: SARS-CoV-2, NAA: NOT DETECTED

## 2019-05-24 ENCOUNTER — Ambulatory Visit: Payer: BC Managed Care – PPO | Attending: Internal Medicine

## 2019-05-24 DIAGNOSIS — Z20822 Contact with and (suspected) exposure to covid-19: Secondary | ICD-10-CM

## 2019-05-25 LAB — NOVEL CORONAVIRUS, NAA: SARS-CoV-2, NAA: NOT DETECTED

## 2019-09-21 IMAGING — CT CT HEAD W/O CM
3 series · 15 of 47 positions shown, 18 images · non-contrast
Comparison: None.

CLINICAL DATA: Headache with neck stiffness. Intermittent
disorientation

EXAM:
CT HEAD WITHOUT CONTRAST
TECHNIQUE: Contiguous axial images were obtained from the base of the skull
through the vertex without intravenous contrast.

[Series 2: head wo · axial · 0.47mm/px · z∈[-160,-35]mm · 9 of 31 slices shown, 12 images]
[im 3/31  brain]
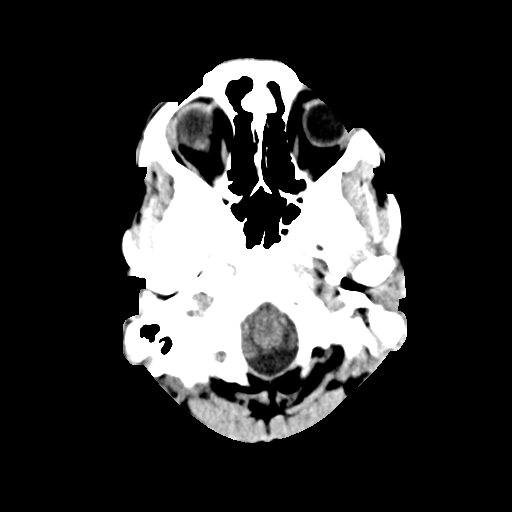
[im 3/31  bone]
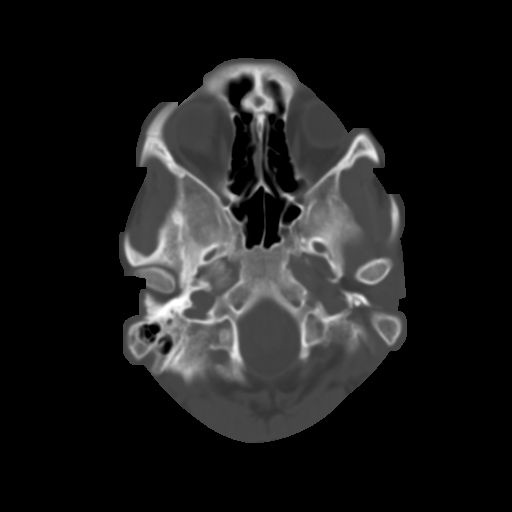
[im 6/31  brain]
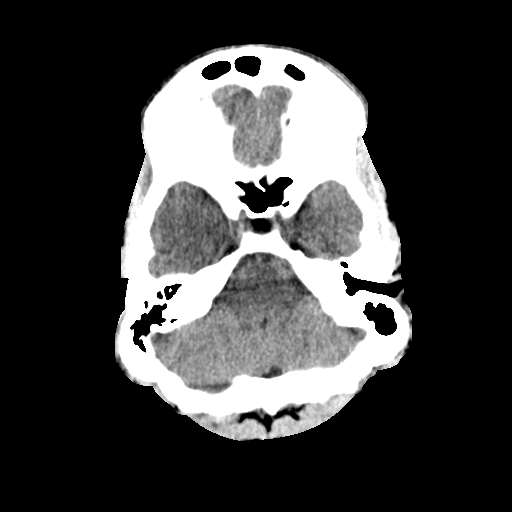
[im 9/31  brain]
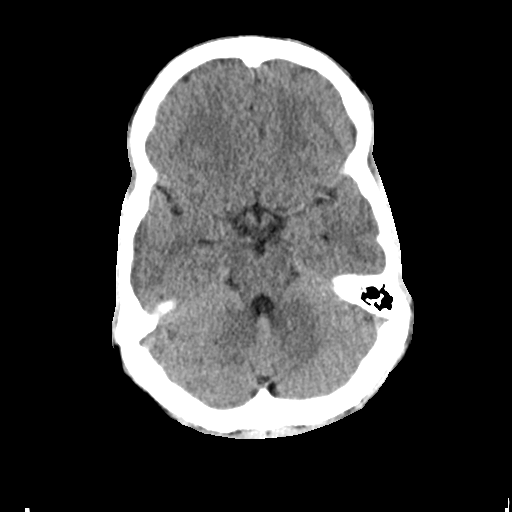
[im 12/31  brain]
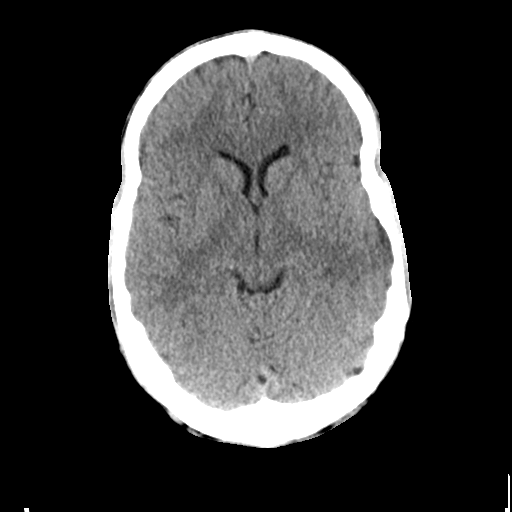
[im 16/31  brain]
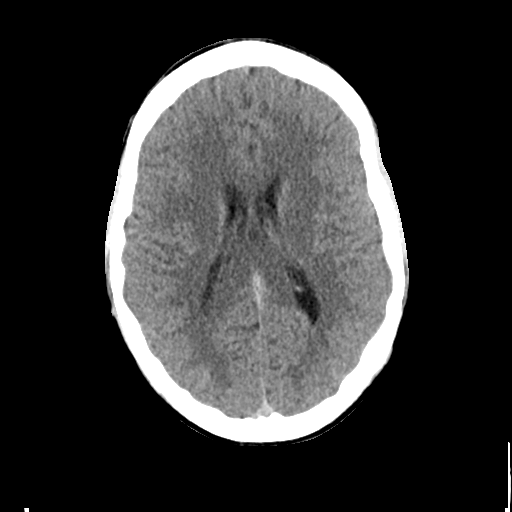
[im 16/31  bone]
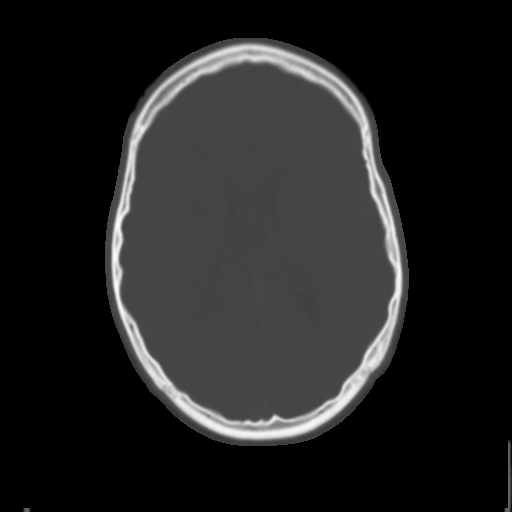
[im 19/31  brain]
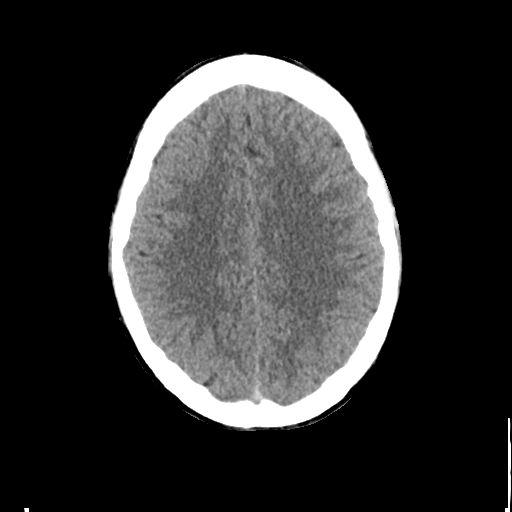
[im 22/31  brain]
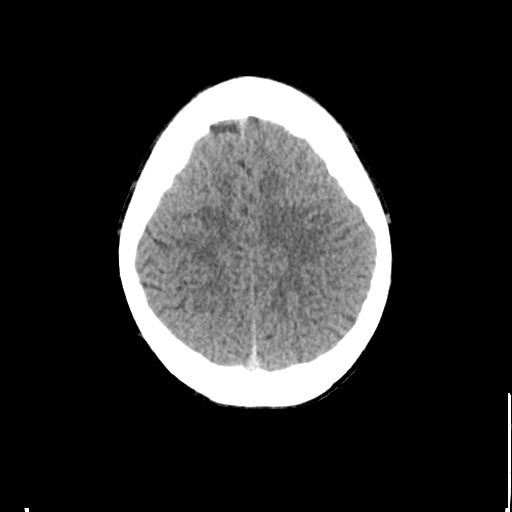
[im 25/31  brain]
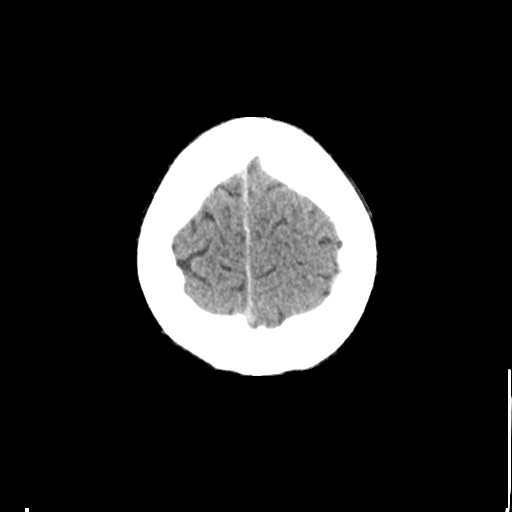
[im 28/31  brain]
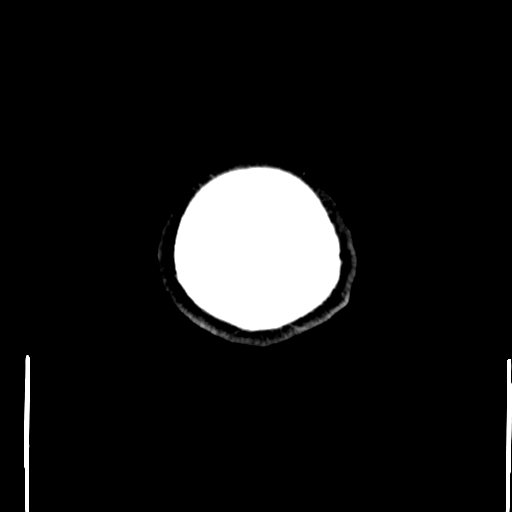
[im 28/31  bone]
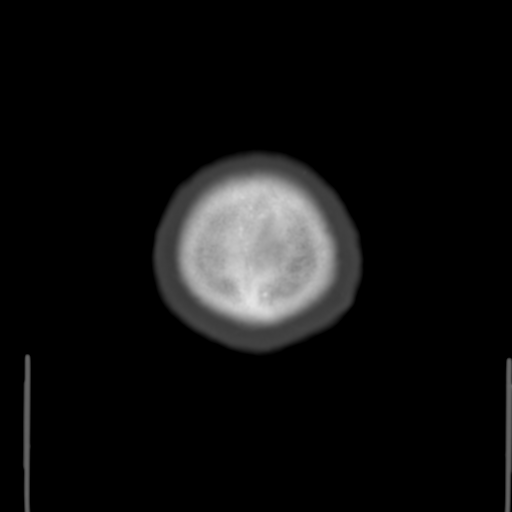

[Series 4: coronal soft tissue · coronal · 0.30mm/px · 3 of 67 slices shown]
[im 23/67  brain]
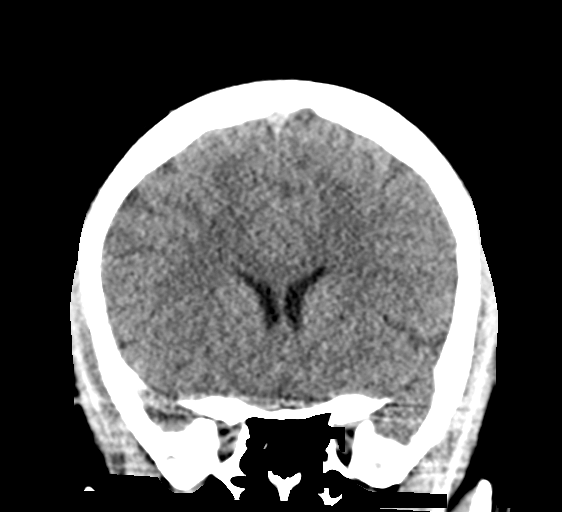
[im 30/67  brain]
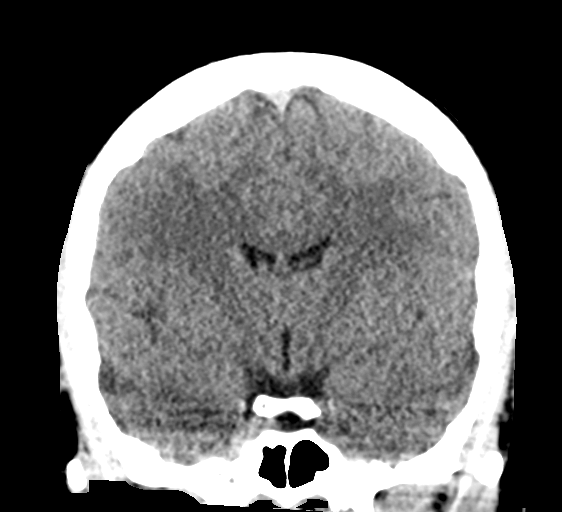
[im 37/67  brain]
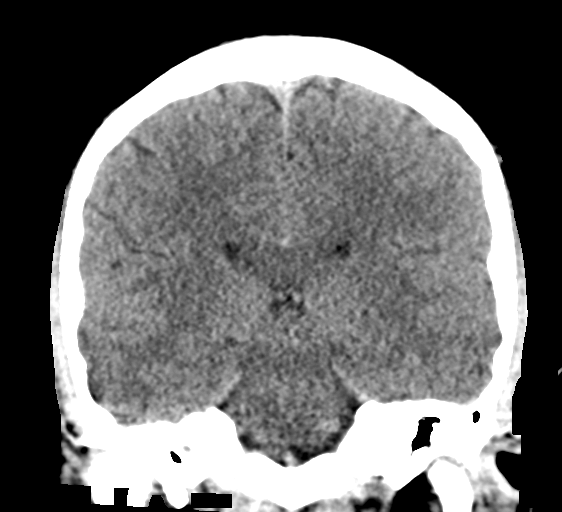

[Series 5: sagittal soft tissue · sagittal · 0.32mm/px · 3 of 53 slices shown]
[im 18/53  brain]
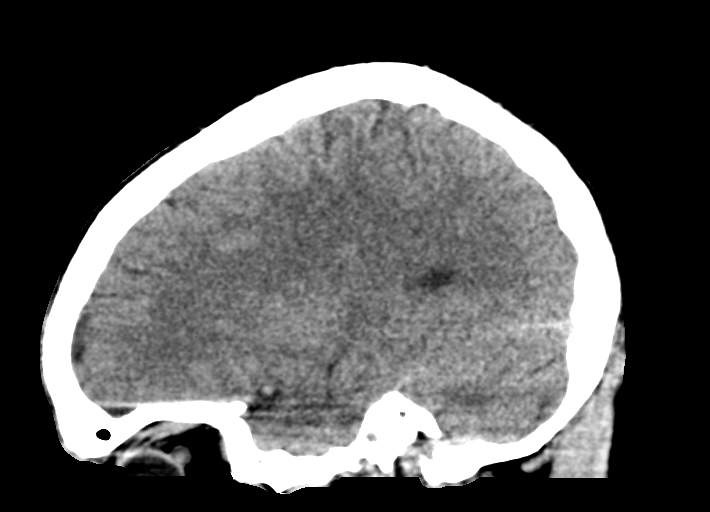
[im 27/53  brain]
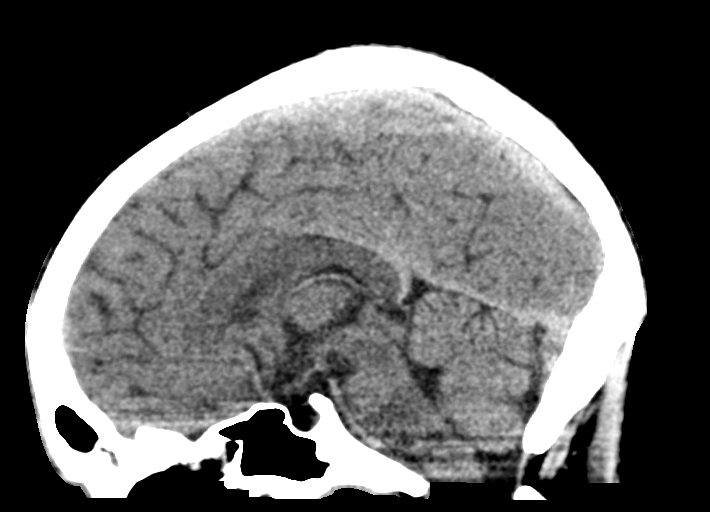
[im 35/53  brain]
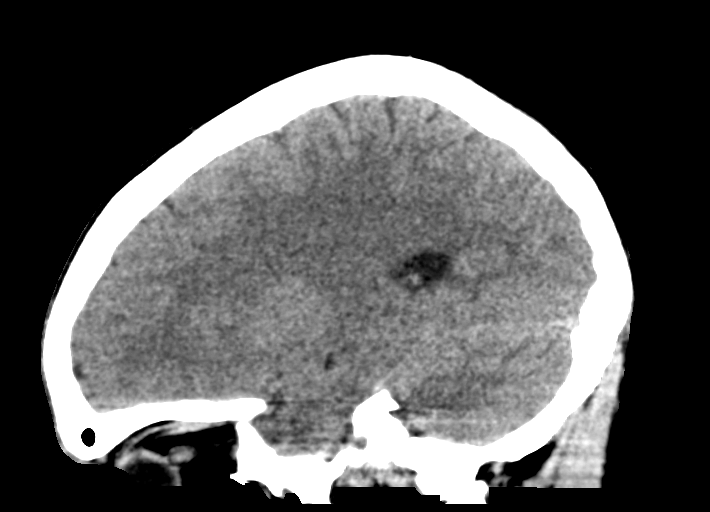

[15 of 47 positions shown; findings below may reference images not displayed]

FINDINGS: Brain: The ventricles are normal in size and configuration. There is
mild invagination of CSF into the sella. There is no intracranial
mass, hemorrhage, extra-axial fluid collection, or midline shift.
Brain parenchyma appears normal. No acute infarct evident.

Vascular: No hyperdense vessel.  No vascular calcification evident.

Skull: Bony calvarium appears intact.

Sinuses/Orbits: There is a subcentimeter osteoma in the left frontal
sinus. Visualized paranasal sinuses are otherwise clear. Visualized
orbits appear symmetric bilaterally.

Other: Mastoid air cells are clear.
IMPRESSION: Mild invagination of CSF into the sella, a finding of questionable
clinical significance. Ventricles normal in size and configuration.
Brain parenchyma appears normal. No mass or hemorrhage.

Subcentimeter benign osteoma left frontal region.

Study otherwise unremarkable.
